# Patient Record
Sex: Female | Born: 1960 | Race: White | Hispanic: No | Marital: Married | Smoking: Never smoker
Health system: Southern US, Community
[De-identification: ages and names within clinical notes are randomized; demographics above are authoritative.]

## PROBLEM LIST (undated history)

## (undated) DIAGNOSIS — M1990 Unspecified osteoarthritis, unspecified site: Secondary | ICD-10-CM

## (undated) DIAGNOSIS — F32A Depression, unspecified: Secondary | ICD-10-CM

## (undated) DIAGNOSIS — F329 Major depressive disorder, single episode, unspecified: Secondary | ICD-10-CM

## (undated) DIAGNOSIS — F419 Anxiety disorder, unspecified: Secondary | ICD-10-CM

## (undated) DIAGNOSIS — F1010 Alcohol abuse, uncomplicated: Secondary | ICD-10-CM

---
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
## 1999-11-10 ENCOUNTER — Encounter: Payer: Self-pay | Admitting: Gynecology

## 1999-11-10 ENCOUNTER — Encounter: Admission: RE | Admit: 1999-11-10 | Discharge: 1999-11-10 | Payer: Self-pay | Admitting: Gynecology

## 2001-12-04 ENCOUNTER — Other Ambulatory Visit: Admission: RE | Admit: 2001-12-04 | Discharge: 2001-12-04 | Payer: Self-pay | Admitting: Gynecology

## 2001-12-10 ENCOUNTER — Encounter: Payer: Self-pay | Admitting: Gynecology

## 2001-12-10 ENCOUNTER — Encounter: Admission: RE | Admit: 2001-12-10 | Discharge: 2001-12-10 | Payer: Self-pay | Admitting: Gynecology

## 2003-01-20 ENCOUNTER — Other Ambulatory Visit: Admission: RE | Admit: 2003-01-20 | Discharge: 2003-01-20 | Payer: Self-pay | Admitting: Gynecology

## 2003-01-24 ENCOUNTER — Encounter: Admission: RE | Admit: 2003-01-24 | Discharge: 2003-01-24 | Payer: Self-pay | Admitting: Gynecology

## 2004-02-17 ENCOUNTER — Other Ambulatory Visit: Admission: RE | Admit: 2004-02-17 | Discharge: 2004-02-17 | Payer: Self-pay | Admitting: Gynecology

## 2004-02-27 ENCOUNTER — Ambulatory Visit (HOSPITAL_COMMUNITY): Admission: RE | Admit: 2004-02-27 | Discharge: 2004-02-27 | Payer: Self-pay | Admitting: Gynecology

## 2005-06-01 ENCOUNTER — Encounter: Admission: RE | Admit: 2005-06-01 | Discharge: 2005-06-01 | Payer: Self-pay | Admitting: Gynecology

## 2005-06-06 ENCOUNTER — Other Ambulatory Visit: Admission: RE | Admit: 2005-06-06 | Discharge: 2005-06-06 | Payer: Self-pay | Admitting: Gynecology

## 2006-07-05 ENCOUNTER — Other Ambulatory Visit: Admission: RE | Admit: 2006-07-05 | Discharge: 2006-07-05 | Payer: Self-pay | Admitting: Gynecology

## 2006-07-05 ENCOUNTER — Encounter: Admission: RE | Admit: 2006-07-05 | Discharge: 2006-07-05 | Payer: Self-pay | Admitting: Gynecology

## 2006-11-20 ENCOUNTER — Encounter (INDEPENDENT_AMBULATORY_CARE_PROVIDER_SITE_OTHER): Payer: Self-pay | Admitting: Gynecology

## 2006-11-20 ENCOUNTER — Ambulatory Visit (HOSPITAL_BASED_OUTPATIENT_CLINIC_OR_DEPARTMENT_OTHER): Admission: RE | Admit: 2006-11-20 | Discharge: 2006-11-20 | Payer: Self-pay | Admitting: Gynecology

## 2007-11-26 ENCOUNTER — Encounter: Admission: RE | Admit: 2007-11-26 | Discharge: 2007-11-26 | Payer: Self-pay | Admitting: Gynecology

## 2009-06-08 ENCOUNTER — Encounter: Admission: RE | Admit: 2009-06-08 | Discharge: 2009-06-08 | Payer: Self-pay | Admitting: Gynecology

## 2010-07-20 NOTE — Op Note (Signed)
Debbie Torres, NAPPI              ACCOUNT NO.:  [id number]   MEDICAL RECORD NO.:  [id number]          PATIENT TYPE:  AMB   LOCATION:  [facility]                         FACILITY:  [facility]   PHYSICIAN:  Gretta Cool, M.D. DATE OF BIRTH:  January 08, 1961   DATE OF PROCEDURE:  11/20/2006  DATE OF DISCHARGE:                               OPERATIVE REPORT   PREOPERATIVE DIAGNOSIS:  Abnormal uterine bleeding secondary to  endometrial polyps.   POSTOPERATIVE DIAGNOSIS:  Abnormal uterine bleeding secondary to  endometrial polyps.   PROCEDURE:  1. Hysteroscopy.  2. Resection of endometrial polyps.  3. Total endometrial resection for ablation.   SURGEON:  Beather Arbour, MD   ANESTHESIA:  IV sedation, paracervical block.   DESCRIPTION OF PROCEDURE:  Under excellent anesthesia as above with the  patient prepped and draped in Allen stirrups, the cervix was grasped  with single-tooth tenaculum and pulled down into view.  It was then  progressively dilated with a series of Pratt dilators to 33, and a 7 mm  resectoscope was then introduced and the entire cavity photographed.  Polyps were identified.  The cornual areas on the left were also filled  with endometrial polyps.  At this point, the polyps were resected.  The  entire cavity was then totally resected so as to remove any potential  for recurrence of endometrial polyps and hopefully any abnormal  bleeding.  There is no evidence of significant adenomyosis.  Once the  entire cavity was resected, the cavity was then treated by VaporTrode at  200 watts pure cut to the entire cavity.  At this point, the procedure  is terminated without complication.  The patient returned to the  recovery room in excellent condition.           ______________________________  Gretta Cool, M.D.     CWL/MEDQ  D:  11/20/2006  T:  11/20/2006  Job:  [id number]   cc:   Billie D. Jillyn Hidden, FNP  [address] [place], [place] [address]

## 2010-08-13 ENCOUNTER — Other Ambulatory Visit: Payer: Self-pay | Admitting: Gynecology

## 2010-08-13 DIAGNOSIS — Z1231 Encounter for screening mammogram for malignant neoplasm of breast: Secondary | ICD-10-CM

## 2010-09-15 ENCOUNTER — Ambulatory Visit
Admission: RE | Admit: 2010-09-15 | Discharge: 2010-09-15 | Disposition: A | Discharge status: Home / Self Care | Payer: BC Managed Care – PPO | Source: Ambulatory Visit | Attending: Gynecology | Admitting: Gynecology

## 2010-09-15 ENCOUNTER — Other Ambulatory Visit: Payer: Self-pay | Admitting: Gynecology

## 2010-09-15 DIAGNOSIS — Z1231 Encounter for screening mammogram for malignant neoplasm of breast: Secondary | ICD-10-CM

## 2010-12-16 LAB — POCT HEMOGLOBIN-HEMACUE
Hemoglobin: 15.8 — ABNORMAL HIGH
Operator id: 268271

## 2011-08-24 ENCOUNTER — Other Ambulatory Visit: Payer: Self-pay | Admitting: Gynecology

## 2011-08-24 DIAGNOSIS — Z1231 Encounter for screening mammogram for malignant neoplasm of breast: Secondary | ICD-10-CM

## 2011-09-12 ENCOUNTER — Other Ambulatory Visit: Payer: Self-pay | Admitting: Gynecology

## 2011-09-12 ENCOUNTER — Ambulatory Visit
Admission: RE | Admit: 2011-09-12 | Discharge: 2011-09-12 | Disposition: A | Discharge status: Home / Self Care | Payer: BC Managed Care – PPO | Source: Ambulatory Visit | Attending: Gynecology | Admitting: Gynecology

## 2011-09-12 DIAGNOSIS — Z1231 Encounter for screening mammogram for malignant neoplasm of breast: Secondary | ICD-10-CM

## 2011-09-15 ENCOUNTER — Ambulatory Visit: Payer: BC Managed Care – PPO

## 2013-03-21 ENCOUNTER — Other Ambulatory Visit: Payer: Self-pay

## 2013-03-21 DIAGNOSIS — Z1231 Encounter for screening mammogram for malignant neoplasm of breast: Secondary | ICD-10-CM

## 2013-04-09 ENCOUNTER — Ambulatory Visit: Payer: BC Managed Care – PPO

## 2016-02-10 ENCOUNTER — Other Ambulatory Visit: Payer: Self-pay | Admitting: Physician Assistant

## 2016-02-10 DIAGNOSIS — R0781 Pleurodynia: Secondary | ICD-10-CM

## 2016-02-17 ENCOUNTER — Other Ambulatory Visit: Payer: Self-pay

## 2016-02-17 ENCOUNTER — Ambulatory Visit
Admission: RE | Admit: 2016-02-17 | Discharge: 2016-02-17 | Disposition: A | Discharge status: Home / Self Care | Payer: BLUE CROSS/BLUE SHIELD | Source: Ambulatory Visit | Attending: Physician Assistant | Admitting: Physician Assistant

## 2016-02-17 DIAGNOSIS — R0781 Pleurodynia: Secondary | ICD-10-CM

## 2017-03-07 DIAGNOSIS — F1010 Alcohol abuse, uncomplicated: Secondary | ICD-10-CM

## 2017-03-07 HISTORY — DX: Alcohol abuse, uncomplicated: F10.10

## 2018-11-13 ENCOUNTER — Other Ambulatory Visit: Payer: Self-pay | Admitting: Gynecology

## 2018-11-13 DIAGNOSIS — N6489 Other specified disorders of breast: Secondary | ICD-10-CM

## 2018-11-20 ENCOUNTER — Other Ambulatory Visit: Payer: BLUE CROSS/BLUE SHIELD

## 2018-11-22 ENCOUNTER — Other Ambulatory Visit: Payer: Self-pay

## 2018-11-22 ENCOUNTER — Ambulatory Visit
Admission: RE | Admit: 2018-11-22 | Discharge: 2018-11-22 | Disposition: A | Discharge status: Home / Self Care | Payer: BC Managed Care – PPO | Source: Ambulatory Visit | Attending: Gynecology | Admitting: Gynecology

## 2018-11-22 ENCOUNTER — Ambulatory Visit: Payer: BLUE CROSS/BLUE SHIELD

## 2018-11-22 DIAGNOSIS — N6489 Other specified disorders of breast: Secondary | ICD-10-CM

## 2018-12-06 ENCOUNTER — Emergency Department (HOSPITAL_COMMUNITY): Payer: BC Managed Care – PPO

## 2018-12-06 ENCOUNTER — Emergency Department (HOSPITAL_COMMUNITY)
Admission: EM | Admit: 2018-12-06 | Discharge: 2018-12-06 | Disposition: A | Discharge status: Home / Self Care | Payer: BC Managed Care – PPO | Attending: Emergency Medicine | Admitting: Emergency Medicine

## 2018-12-06 ENCOUNTER — Other Ambulatory Visit: Payer: Self-pay

## 2018-12-06 ENCOUNTER — Encounter (HOSPITAL_COMMUNITY): Payer: Self-pay | Admitting: Emergency Medicine

## 2018-12-06 DIAGNOSIS — R531 Weakness: Secondary | ICD-10-CM | POA: Insufficient documentation

## 2018-12-06 DIAGNOSIS — R112 Nausea with vomiting, unspecified: Secondary | ICD-10-CM | POA: Diagnosis not present

## 2018-12-06 DIAGNOSIS — R110 Nausea: Secondary | ICD-10-CM | POA: Diagnosis present

## 2018-12-06 DIAGNOSIS — Z79899 Other long term (current) drug therapy: Secondary | ICD-10-CM | POA: Insufficient documentation

## 2018-12-06 HISTORY — DX: Depression, unspecified: F32.A

## 2018-12-06 HISTORY — DX: Anxiety disorder, unspecified: F41.9

## 2018-12-06 HISTORY — DX: Unspecified osteoarthritis, unspecified site: M19.90

## 2018-12-06 LAB — CBC WITH DIFFERENTIAL/PLATELET
Abs Immature Granulocytes: 0.02 10*3/uL (ref 0.00–0.07)
Basophils Absolute: 0 10*3/uL (ref 0.0–0.1)
Basophils Relative: 1 %
Eosinophils Absolute: 0.1 10*3/uL (ref 0.0–0.5)
Eosinophils Relative: 1 %
HCT: 44.7 % (ref 36.0–46.0)
Hemoglobin: 15.3 g/dL — ABNORMAL HIGH (ref 12.0–15.0)
Immature Granulocytes: 0 %
Lymphocytes Relative: 26 %
Lymphs Abs: 1.6 10*3/uL (ref 0.7–4.0)
MCH: 33.7 pg (ref 26.0–34.0)
MCHC: 34.2 g/dL (ref 30.0–36.0)
MCV: 98.5 fL (ref 80.0–100.0)
Monocytes Absolute: 0.5 10*3/uL (ref 0.1–1.0)
Monocytes Relative: 8 %
Neutro Abs: 4 10*3/uL (ref 1.7–7.7)
Neutrophils Relative %: 64 %
Platelets: 224 10*3/uL (ref 150–400)
RBC: 4.54 MIL/uL (ref 3.87–5.11)
RDW: 13.1 % (ref 11.5–15.5)
WBC: 6.2 10*3/uL (ref 4.0–10.5)
nRBC: 0 % (ref 0.0–0.2)

## 2018-12-06 LAB — URINALYSIS, ROUTINE W REFLEX MICROSCOPIC
Bacteria, UA: NONE SEEN
Bilirubin Urine: NEGATIVE
Glucose, UA: NEGATIVE mg/dL
Hgb urine dipstick: NEGATIVE
Ketones, ur: 5 mg/dL — AB
Nitrite: NEGATIVE
Protein, ur: NEGATIVE mg/dL
Specific Gravity, Urine: 1.024 (ref 1.005–1.030)
pH: 5 (ref 5.0–8.0)

## 2018-12-06 LAB — COMPREHENSIVE METABOLIC PANEL
ALT: 15 U/L (ref 0–44)
AST: 35 U/L (ref 15–41)
Albumin: 3.4 g/dL — ABNORMAL LOW (ref 3.5–5.0)
Alkaline Phosphatase: 64 U/L (ref 38–126)
Anion gap: 10 (ref 5–15)
BUN: 14 mg/dL (ref 6–20)
CO2: 23 mmol/L (ref 22–32)
Calcium: 8.6 mg/dL — ABNORMAL LOW (ref 8.9–10.3)
Chloride: 109 mmol/L (ref 98–111)
Creatinine, Ser: 0.84 mg/dL (ref 0.44–1.00)
GFR calc Af Amer: >60 mL/min (ref >60)
GFR calc non Af Amer: >60 mL/min (ref >60)
Glucose, Bld: 99 mg/dL (ref 70–99)
Potassium: 4.6 mmol/L (ref 3.5–5.1)
Sodium: 142 mmol/L (ref 135–145)
Total Bilirubin: 1 mg/dL (ref 0.3–1.2)
Total Protein: 5.5 g/dL — ABNORMAL LOW (ref 6.5–8.1)

## 2018-12-06 LAB — LIPASE, BLOOD: Lipase: 100 U/L — ABNORMAL HIGH (ref 11–51)

## 2018-12-06 MED ORDER — SODIUM CHLORIDE 0.9 % IV BOLUS
1000.0000 mL | Freq: Once | INTRAVENOUS | Status: AC
  Administered 2018-12-06: 1000 mL via INTRAVENOUS

## 2018-12-06 MED ORDER — ONDANSETRON HCL 4 MG/2ML IJ SOLN
4.0000 mg | Freq: Once | INTRAMUSCULAR | Status: AC
  Administered 2018-12-06: 16:00:00 4 mg via INTRAVENOUS
  Filled 2018-12-06: qty 2

## 2018-12-06 NOTE — Discharge Instructions (Addendum)
You have been seen today for nausea, weakness. Please read and follow all provided instructions. Return to the emergency room for worsening condition or new concerning symptoms.    1. Medications:  Continue usual home medications -We did not start you on any blood pressure medications today.  Your blood pressure was well controlled while you were in the emergency department.    2. Treatment: rest, drink plenty of fluids  3. Follow Up: Please follow up with your primary doctor in 1 week. Please follow-up with your primary care doctor to have blood pressure rechecked and discuss possible medication management if needed.  You should check your blood pressure daily when you first wake up and keep a log to bring to the follow up visit. -Also recommend you have your lipase rechecked within 1 week. This is done by drawing blood.  It is also a possibility that you have an allergic reaction to any of the medicines that you have been prescribed - Everybody reacts differently to medications and while MOST people have no trouble with most medicines, you may have a reaction such as nausea, vomiting, rash, swelling, shortness of breath. If this is the case, please stop taking the medicine immediately and contact your physician.  ?

## 2018-12-06 NOTE — ED Provider Notes (Signed)
[facility] EMERGENCY DEPARTMENT Provider Note   CSN: [id number] Arrival date & time: 12/06/18  1447     History   Chief Complaint Chief Complaint  Patient presents with  . Emesis  . Weakness    HPI Debbie Mcphearson Torres is a 58 y.o. female past medical history significant for anxiety, arthritis, depression presents emergency room today with chief complaint of nausea and weakness.  Onset was acute starting 3 hours ago.  She states at work she just felt off.  She describes her symptoms as feeling weak, nauseated and foggy. She sat down and the symptoms did not improve.  She checked her blood pressure and it was 200/150.  She reports associated nausea without emesis.  Denies fever, chills, headache, visual changes, dizziness, lightheadedness, chest pain, cough, shortness of breath, abdominal pain, urinary symptoms, diarrhea.  She denies any sick contacts.  Denies any contact with anyone positive for COVID-19.  She not take any medications prior to arrival.  Patient reports she had a recent gynecology visit and she was found to be hypertensive at that appointment.  She has not been on antihypertension medication and several years because in the past it made her not feel well.  She has instead been trying lifestyle changes.  Patient admits to daily alcohol consumption, drinking 3-4 beers per day.  Denies any tobacco or drug use.  History provided by patient with additional history obtained from chart review.      Past Medical History:  Diagnosis Date  . Alcohol abuse 2019  . Anxiety   . Arthritis    Back and hands   . Depression     There are no active problems to display for this patient.   History reviewed. No pertinent surgical history.   OB History   No obstetric history on file.      Home Medications    Prior to Admission medications   Medication Sig Start Date End Date Taking? Authorizing Provider  BIOTIN PO Take 1 tablet by mouth daily.    Yes  [provider]  buPROPion (WELLBUTRIN XL) 300 MG 24 hr tablet Take 300 mg by mouth daily.   Yes [provider]  Cholecalciferol (VITAMIN D3 PO) Take 1 tablet by mouth daily.    Yes [provider]  DULoxetine (CYMBALTA) 30 MG capsule Take 30 mg by mouth daily.   Yes [provider]  Ferrous Sulfate (IRON) 325 (65 Fe) MG TABS Take by mouth.   Yes [provider]  minocycline (MINOCIN) 100 MG capsule Take 100 mg by mouth every other day.   Yes [provider]  Multiple Vitamin (MULTIVITAMIN WITH MINERALS) TABS tablet Take 1 tablet by mouth daily.   Yes [provider]  naproxen sodium (ALEVE) 220 MG tablet Take 220 mg by mouth daily as needed (for pain).   Yes [provider]  zaleplon (SONATA) 10 MG capsule Take 120 mg by mouth at bedtime.   Yes [provider]    Family History History reviewed. No pertinent family history.  Social History Social History   Tobacco Use  . Smoking status: Never Smoker  Substance Use Topics  . Alcohol use: Yes    Comment: Daily   . Drug use: Never     Allergies   Codeine   Review of Systems Review of Systems  Constitutional: Negative for chills and fever.  HENT: Negative for congestion, ear discharge, ear pain, sinus pressure, sinus pain and sore throat.  Eyes: Negative for pain, redness and visual disturbance.  Respiratory: Negative for cough and shortness of breath.   Cardiovascular: Negative for chest pain.  Gastrointestinal: Positive for nausea. Negative for abdominal pain, constipation, diarrhea and vomiting.  Genitourinary: Negative for dysuria and hematuria.  Musculoskeletal: Negative for back pain and neck pain.  Skin: Negative for wound.  Neurological: Positive for weakness. Negative for dizziness, syncope, speech difficulty, numbness and headaches.     Physical Exam Updated Vital Signs BP 138/79   Pulse 77   Temp 98.3 F (36.8 C) (Oral)   Resp  13   SpO2 99%   Physical Exam Vitals signs and nursing note reviewed.  Constitutional:      General: She is not in acute distress.    Appearance: She is not ill-appearing.  HENT:     Head: Normocephalic and atraumatic.     Comments: No tenderness to palpation of skull. No deformities or crepitus noted. No open wounds, abrasions or lacerations.    Right Ear: Tympanic membrane and external ear normal.     Left Ear: Tympanic membrane and external ear normal.     Nose: Nose normal.     Mouth/Throat:     Mouth: Mucous membranes are moist.     Pharynx: Oropharynx is clear.  Eyes:     General: No scleral icterus.       Right eye: No discharge.        Left eye: No discharge.     Extraocular Movements: Extraocular movements intact.     Conjunctiva/sclera: Conjunctivae normal.     Pupils: Pupils are equal, round, and reactive to light.  Neck:     Musculoskeletal: Normal range of motion.     Vascular: No JVD.     Comments: Full ROM intact without spinous process TTP. No bony stepoffs or deformities, no paraspinous muscle TTP or muscle spasms. No rigidity or meningeal signs. No bruising, erythema, or swelling.  Cardiovascular:     Rate and Rhythm: Normal rate and regular rhythm.     Pulses: Normal pulses.          Radial pulses are 2+ on the right side and 2+ on the left side.     Heart sounds: Normal heart sounds.  Pulmonary:     Comments: Lungs clear to auscultation in all fields. Symmetric chest rise. No wheezing, rales, or rhonchi. Abdominal:     Comments: Abdomen is soft, non-distended, and non-tender in all quadrants. No rigidity, no guarding. No peritoneal signs.  Musculoskeletal: Normal range of motion.  Skin:    General: Skin is warm and dry.     Capillary Refill: Capillary refill takes less than 2 seconds.  Neurological:     Mental Status: She is oriented to person, place, and time.     GCS: GCS eye subscore is 4. GCS verbal subscore is 5. GCS motor subscore is 6.      Comments: Speech is clear and goal oriented, follows commands CN III-XII intact, no facial droop Normal strength in upper and lower extremities bilaterally including dorsiflexion and plantar flexion, strong and equal grip strength Sensation normal to light and sharp touch Moves extremities without ataxia, coordination intact Normal finger to nose and rapid alternating movements Normal gait and balance  Psychiatric:        Behavior: Behavior normal.      ED Treatments / Results  Labs (all labs ordered are listed, but only abnormal results are displayed) Labs Reviewed  COMPREHENSIVE METABOLIC PANEL - Abnormal;  Notable for the following components:      Result Value   Calcium 8.6 (*)    Total Protein 5.5 (*)    Albumin 3.4 (*)    All other components within normal limits  LIPASE, BLOOD - Abnormal; Notable for the following components:   Lipase 100 (*)    All other components within normal limits  CBC WITH DIFFERENTIAL/PLATELET - Abnormal; Notable for the following components:   Hemoglobin 15.3 (*)    All other components within normal limits  URINALYSIS, ROUTINE W REFLEX MICROSCOPIC - Abnormal; Notable for the following components:   Color, Urine AMBER (*)    APPearance CLOUDY (*)    Ketones, ur 5 (*)    Leukocytes,Ua TRACE (*)    All other components within normal limits  URINE CULTURE    EKG EKG Interpretation  Date/Time:  Thursday December 06 2018 14:50:24 EDT Ventricular Rate:  76 PR Interval:    QRS Duration: 86 QT Interval:  391 QTC Calculation: 440 R Axis:   65 Text Interpretation:  Sinus rhythm Low voltage, precordial leads No old tracing to compare Confirmed by Melene PlanFloyd, Dan [phone]([id number]) on 12/06/2018 2:52:48 PM   Radiology Dg Chest 2 View  Result Date: 12/06/2018 CLINICAL DATA:  Per ED triage notes: c/o "feeling off". Pt states she began feeling weak, nauseated, and foggy about 3 hours ago. Fire Dept noted a BP of 200/150 initially. Upon EMS arrival pt still feeling  foggy and nauseated. Pt reports a recent visit with PCP in which MD reported pt to be hypertensive but no medications were prescribed. No CP, No SHOB, No neuro deficits. EXAM: CHEST - 2 VIEW COMPARISON:  Chest CT, 02/08/2016 FINDINGS: The heart size and mediastinal contours are within normal limits. Both lungs are clear. No pleural effusion or pneumothorax. The visualized skeletal structures are unremarkable. IMPRESSION: No active cardiopulmonary disease. Electronically Signed   By: Amie Portlandavid  Ormond M.D.   On: 12/06/2018 17:38    Procedures Procedures (including critical care time)  Medications Ordered in ED Medications  sodium chloride 0.9 % bolus 1,000 mL (0 mLs Intravenous Stopped 12/06/18 1630)  ondansetron (ZOFRAN) injection 4 mg (4 mg Intravenous Given 12/06/18 1540)     Initial Impression / Assessment and Plan / ED Course  I have reviewed the triage vital signs and the nursing notes.  Pertinent labs & imaging results that were available during my care of the patient were reviewed by me and considered in my medical decision making (see chart for details).  Patient seen and examined. Patient nontoxic appearing, in no apparent distress. On arrival BP was 160/81. No tachycardia, hypoxia or tachypnea.  Clear to auscultation all fields.  Abdomen is soft nontender, no peritoneal signs.  Neuro exam is without focal deficit.  CBC unremarkable.  UA without trace leukocytes, 0-5 WBC, doubt UTI as patient has no urinary symptoms.  Will send for culture.  CMP overall unremarkable.  Lipase is 100.  On reassessment patient continues to have benign abdomen.  No epigastric tenderness, doubt surgical abdomen or acute pancreatitis requiring admission. EKG viewed by me with no ischemic changes. Chest xray is unremarkable, no infiltrate seen. Pt feels better after IVF and zofran. She is tolerating PO fluids. Blood pressure has been stable while in ED, no signs of hypertensive urgency or emergency. Discussed at  length the risks associated with alcohol abuse. She is interested in cutting back and feels she can do this with the support of her husband. The patient appears reasonably screened  and/or stabilized for discharge and I doubt any other medical condition or other [facility] requiring further screening, evaluation, or treatment in the ED at this time prior to discharge. The patient is safe for discharge with strict return precautions discussed. Recommend pcp follow up to have blood pressure rechecked in 1 week and recheck lipase. Findings and plan of care discussed with supervising physician Dr. Rodena Medin.  Portions of this note were generated with Scientist, clinical (histocompatibility and immunogenetics). Dictation errors may occur despite best attempts at proofreading.  Vitals:   12/06/18 1630 12/06/18 1645 12/06/18 1700 12/06/18 1715  BP: (!) 151/86 (!) 144/82 135/85 (!) 143/82  Pulse: 89 81  79  Resp: 17 13 (!) 21 14  Temp:      TempSrc:      SpO2: 99% 97%  98%     Final Clinical Impressions(s) / ED Diagnoses   Final diagnoses:  Nausea    ED Discharge Orders    None       Sherene Sires, PA-C 12/06/18 1834    Wynetta Fines, MD 12/06/18 [phone]

## 2018-12-06 NOTE — ED Notes (Signed)
Spoke with lab in regards to Urine Culture, will process as soon as possible.

## 2018-12-06 NOTE — ED Notes (Signed)
Patient verbalizes understanding of discharge instructions. Opportunity for questioning and answers were provided. Armband removed by staff, pt discharged from ED.  

## 2018-12-06 NOTE — ED Notes (Signed)
Gave pt water, tolerated well Pt reporting no nausea currently

## 2018-12-06 NOTE — ED Triage Notes (Signed)
Per EMS: pt here from work [employer]) with c/o "feeling off".  Pt states she began feeling weak, nauseated, and foggy about 3 hours ago.  Fire Dept noted a BP of 200/150 initially.  Upon EMS arrival pt still feeling foggy and nauseated.  Pt reports a recent visit with PCP in which MD reported pt to be hypertensive but no medications were prescribed.  No CP, No SHOB, No neuro  deficits.    EMS vitals: BP 120/80 175/98 HR 80 RR 14 CBG 92  Sp02 100% RA

## 2018-12-06 NOTE — ED Notes (Addendum)
Pt reporting shoes feel tight suddenly which as she states would be unusual considering her feet are elevated.  Edema noted to be a +1 to bilateral lower extremities.  Dorsalis pedis pulses - strong bilaterally

## 2018-12-07 LAB — URINE CULTURE: Culture: NO GROWTH

## 2021-05-18 IMAGING — MG MM DIGITAL DIAGNOSTIC UNILAT*L* W/ TOMO W/ CAD
8 series · 9 of 24 positions shown · non-contrast
Comparison: Previous exam(s).

CLINICAL DATA: Patient presents as a recall from screening for a
possible left breast asymmetry.

EXAM:
DIGITAL DIAGNOSTIC UNILATERAL LEFT MAMMOGRAM WITH CAD AND TOMO

[L MLO synth-2D (1 of 3)]
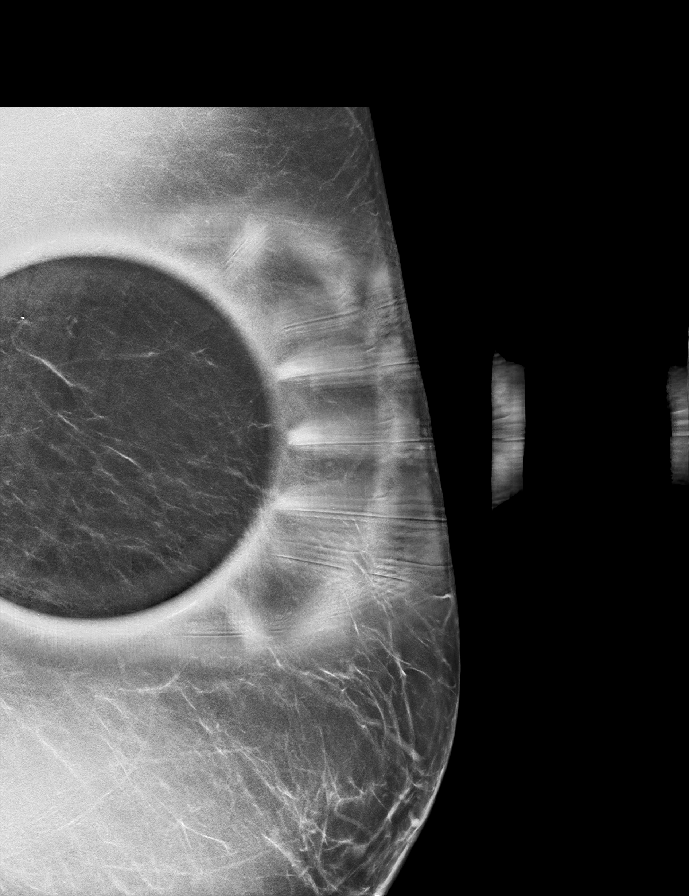

[L ML synth-2D]
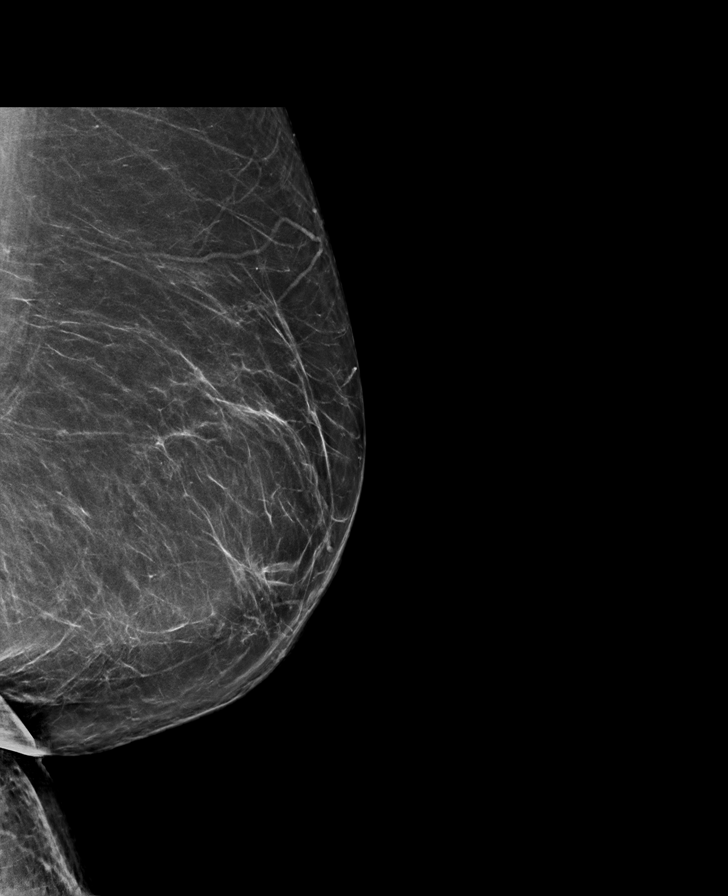

[L MLO synth-2D (2 of 3)]
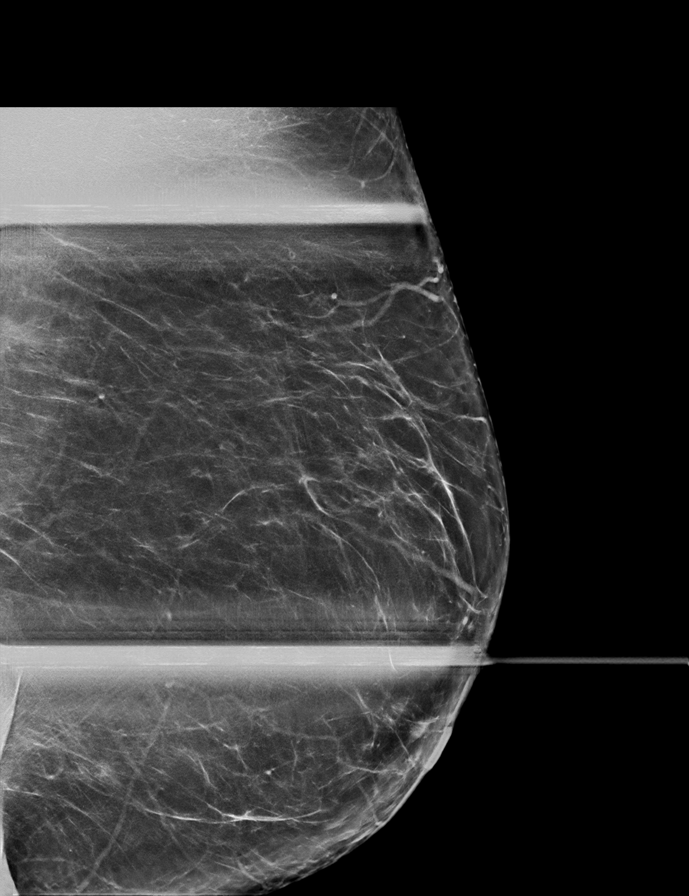

[L MLO synth-2D (3 of 3)]
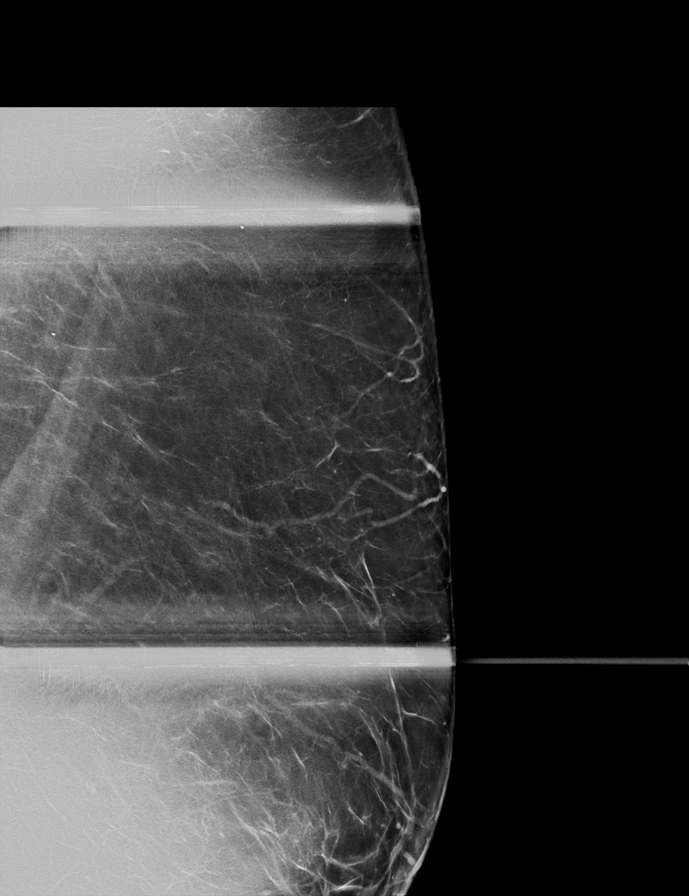

[L ML tomo · 2 of 82 frames shown]
[frame 27/82]
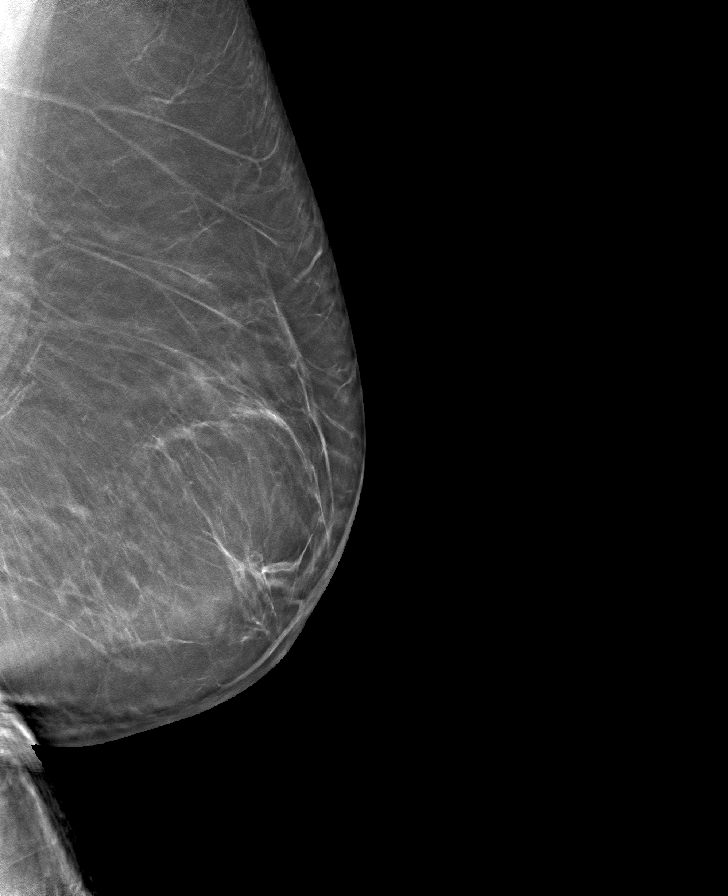
[frame 41/82]
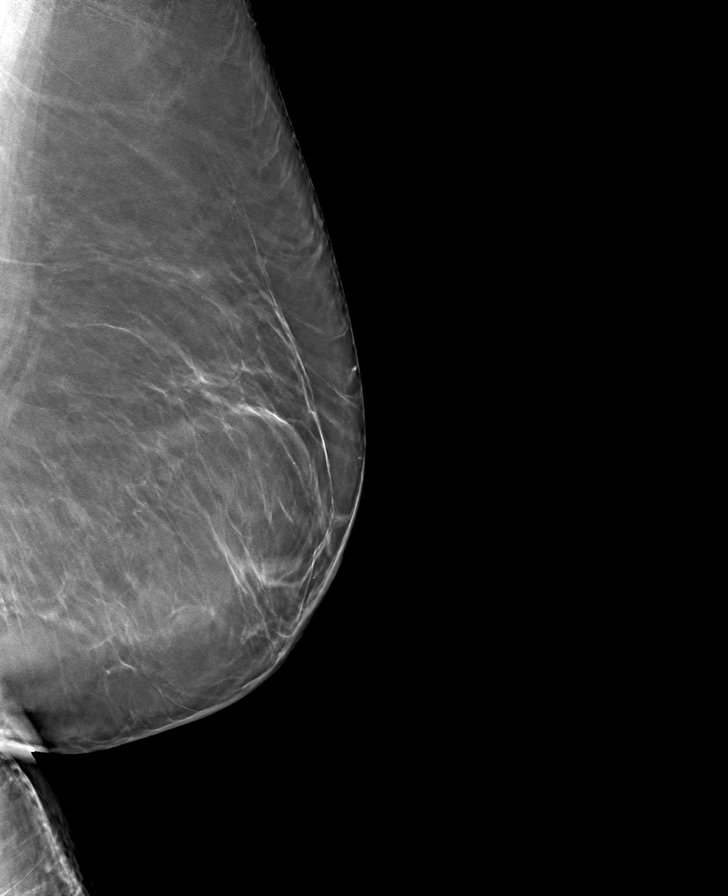

[L MLO tomo (1 of 3) · tomo slice 45/90.0]
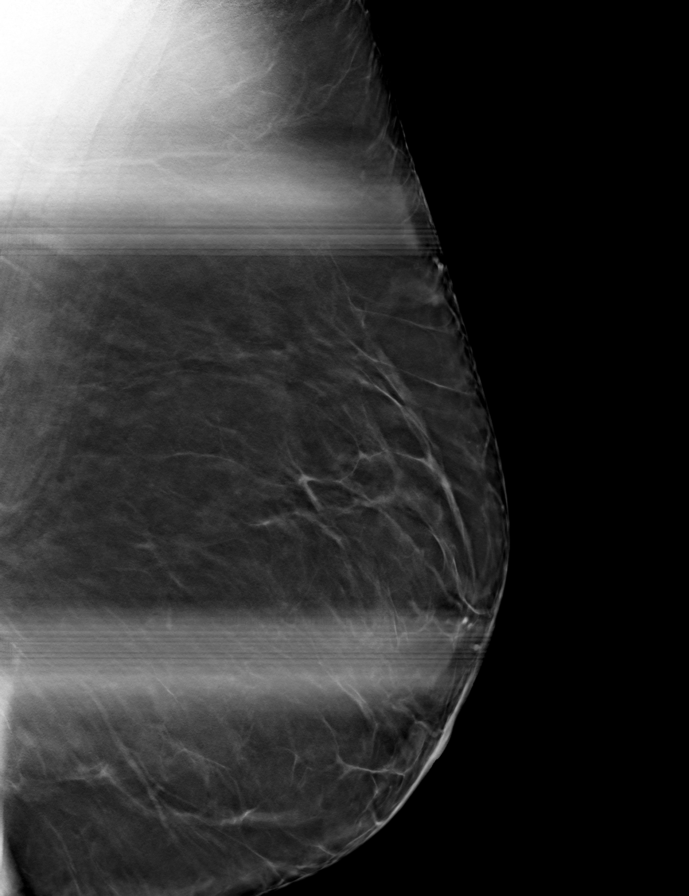

[L MLO tomo (2 of 3) · tomo slice 39/77.0]
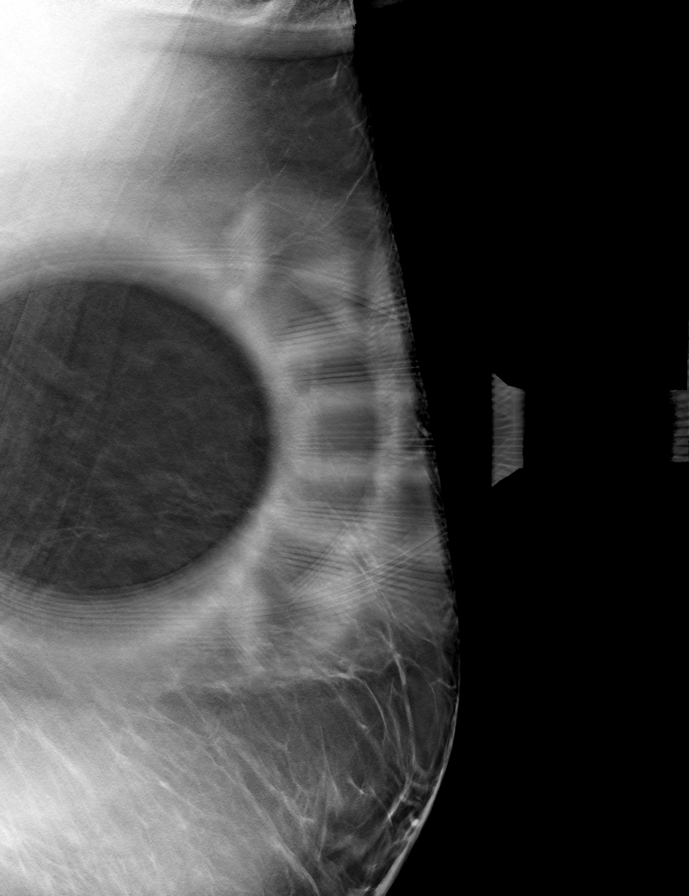

[L MLO tomo (3 of 3) · tomo slice 45/90.0]
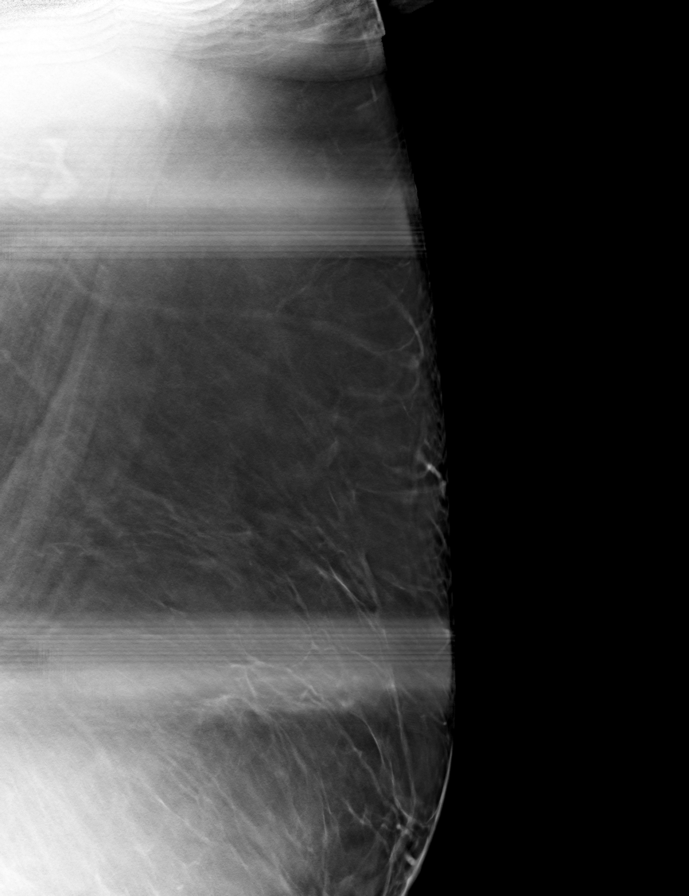

[9 of 24 positions shown; findings below may reference images not displayed]

ACR Breast Density Category b: There are scattered areas of
fibroglandular density.
FINDINGS: Additional spot compression tomosynthesis and full field mL views
were performed for the questioned abnormality in the left breast. On
the additional imaging the tissue in this area disperses, consistent
with normal overlapping fibroglandular tissue. No underlying mass or
distortion identified.

Mammographic images were processed with CAD.
IMPRESSION: No mammographic evidence of malignancy in the left breast.

RECOMMENDATION:
Screening mammogram in one year.(Code:NN-3-NTU)

I have discussed the findings and recommendations with the patient.
If applicable, a reminder letter will be sent to the patient
regarding the next appointment.

BI-RADS CATEGORY  1: Negative.

## 2021-06-01 IMAGING — DX DG CHEST 2V
2 series · 2 of 2 positions shown · non-contrast
Comparison: Chest CT, 02/08/2016

CLINICAL DATA: Per ED triage notes: c/o "feeling off". Pt states
she began feeling weak, nauseated, and foggy about 3 hours ago. Fire
Dept noted a BP of 200/150 initially. Upon EMS arrival pt still
feeling foggy and nauseated. Pt reports a recent visit with PCP in
which MAHELI reported pt to be hypertensive but no medications were
prescribed. No CP, No SHOB, No neuro deficits.

EXAM:
CHEST - 2 VIEW

[w chest pa]
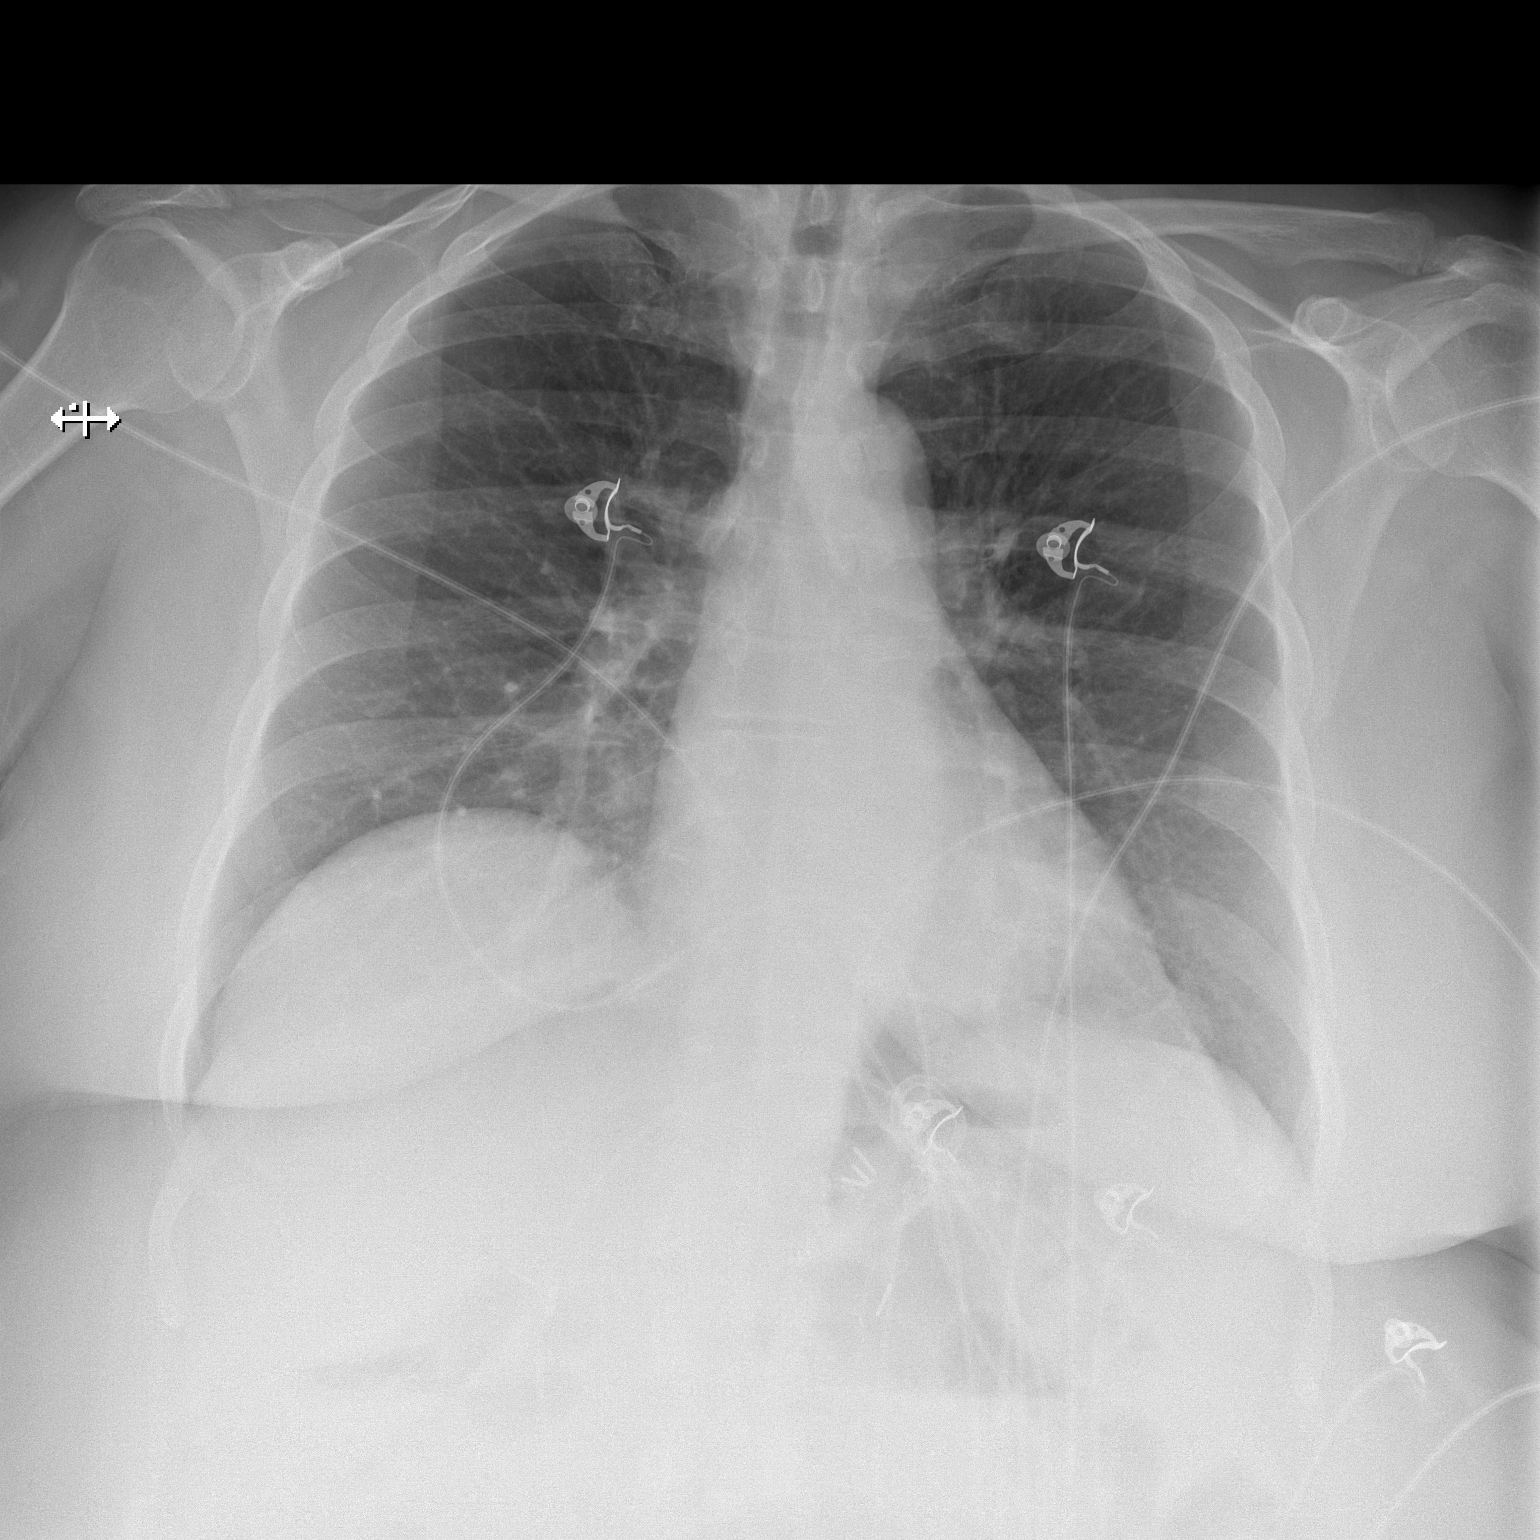

[w chest lat]
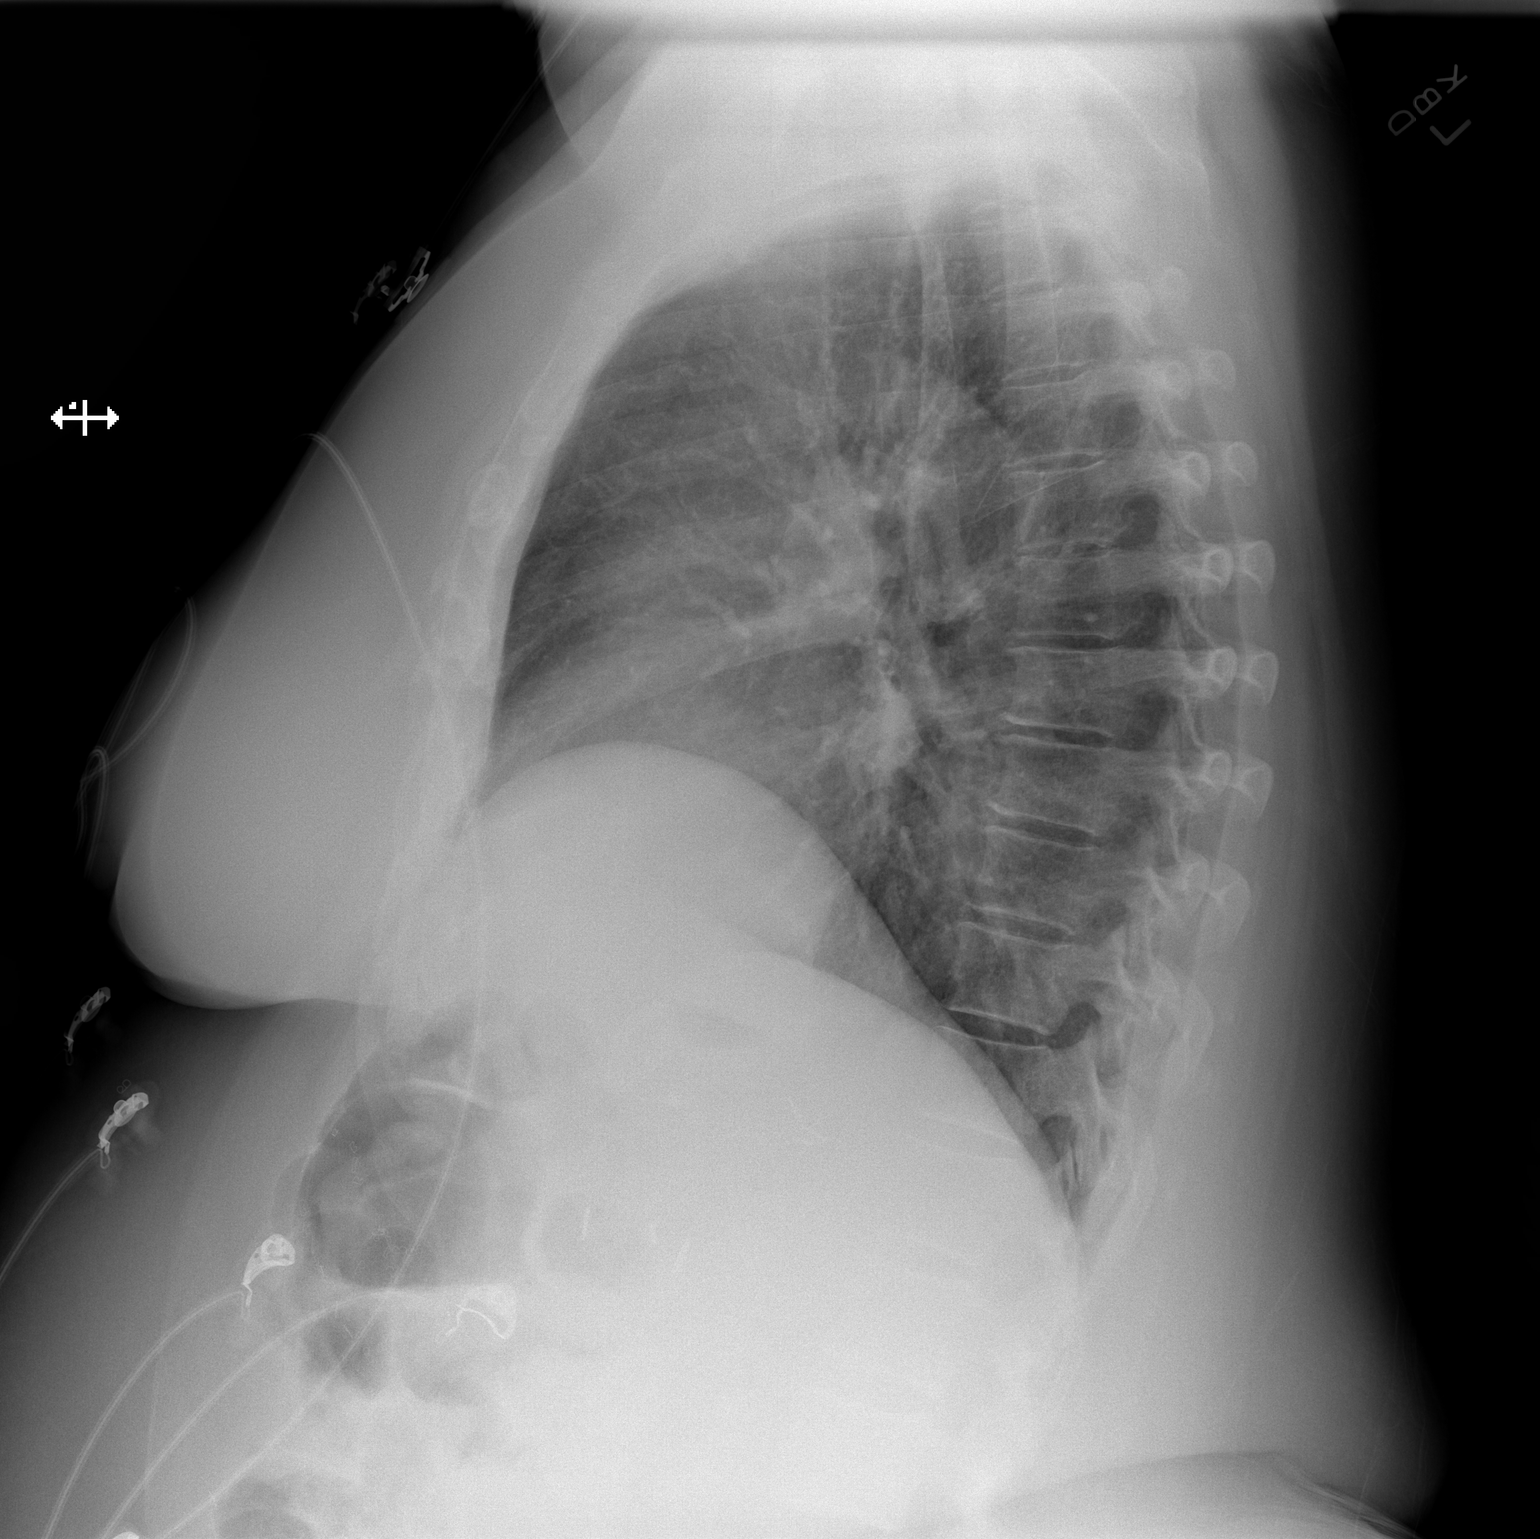

[2 of 2 positions shown; findings below may reference images not displayed]

FINDINGS: The heart size and mediastinal contours are within normal limits.
Both lungs are clear. No pleural effusion or pneumothorax. The
visualized skeletal structures are unremarkable.
IMPRESSION: No active cardiopulmonary disease.
